# Patient Record
Sex: Female | Born: 1965 | Race: Black or African American | Hispanic: No | Marital: Single
Health system: Midwestern US, Community
[De-identification: ages and names within clinical notes are randomized; demographics above are authoritative.]

## PROBLEM LIST (undated history)

## (undated) DIAGNOSIS — M7732 Calcaneal spur, left foot: Secondary | ICD-10-CM

---

## 2017-05-05 NOTE — Other (Signed)
[facility]  Preoperative Instructions        Surgery Date 05/07/17          Time of Arrival 1430    1. On the day of your surgery, please report to the Surgical Services Registration Desk and sign in at your designated time. The Surgery Center is located to the right of the Emergency Room.     2. You must have someone with you to drive you home. You should not drive a car for 24 hours following surgery. Please make arrangements for a friend or family member to stay with you for the first 24 hours after your surgery.    3. Do not have anything to eat or drink (including water, gum, mints, coffee, juice) after midnight 05/06/17.??      Marland Kitchen?This may not apply to medications prescribed by your physician. ?(Please note below the special instructions with medications to take the morning of your procedure.)    4. We recommend you do not drink any alcoholic beverages for 24 hours before and after your surgery.    5. Contact your surgeon???s office for instructions on the following medications: non-steroidal anti-inflammatory drugs (i.e. Advil, Aleve), vitamins, and supplements. (Some surgeon???s will want you to stop these medications prior to surgery and others may allow you to take them)  **If you are currently taking Plavix, Coumadin, Aspirin and/or other blood-thinning agents, contact your surgeon for instructions.** Your surgeon will partner with the physician prescribing these medications to determine if it is safe to stop or if you need to continue taking.  Please do not stop taking these medications without instructions from your surgeon    6. Wear comfortable clothes.  Wear glasses instead of contacts.  Do not bring any money or jewelry. Please bring picture ID, insurance card, and any prearranged co-payment or hospital payment.  Do not wear make-up, particularly mascara the morning of your surgery.  Do not wear nail polish, particularly if you are having foot /hand surgery.  Wear your hair  loose or down, no ponytails, buns, bobby pins or clips.  All body piercings must be removed.  Please shower with antibacterial soap for three consecutive days before and on the morning of surgery, but do not apply any lotions, powders or deodorants after the shower on the day of surgery. Please use a fresh towels after each shower. Please sleep in clean clothes and change bed linens the night before surgery.  Please do not shave for 48 hours prior to surgery. Shaving of the face is acceptable.    7. You should understand that if you do not follow these instructions your surgery may be cancelled.  If your physical condition changes (I.e. fever, cold or flu) please contact your surgeon as soon as possible.    8. It is important that you be on time.  If a situation occurs where you may be late, please call [phone] (OR Holding Area).    9. If you have any questions and or problems, please call [phone] (Pre-admission Testing).    10. Your surgery time may be subject to change.  You will receive a phone call the evening prior if your time changes.    11.  If having outpatient surgery, you must have someone to drive you here, stay with you during the duration of your stay, and to drive you home at time of discharge.    12.   In an effort to improve the efficiency, privacy, and  safety for all of our Pre-op patients visitors are not allowed in the Holding area.  Once you arrive and are registered your family/visitors will be asked to remain in the waiting room.  The Pre-op staff will get you from the Surgical Waiting Area and will explain to you and your family/visitors that the Pre-op phase is beginning.  The staff will answer any questions and provide instructions for tracking of the patient, by use of the existing tracking number and color-coded status board in the waiting room.  At this time the staff will also ask for your designated spokesperson information  in the event that the physician or staff need to provide an update or obtain any pertinent information. The designated spokesperson will be notified if the physician needs to speak to family during the pre-operative phase.  If at any time your family/visitors has questions or concerns they may approach the volunteer desk in the waiting area for assistance.         Special Instructions:none    MEDICATIONS TO TAKE THE MORNING OF SURGERY WITH A SIP OF WATER:take migraine meds if needed.      I understand a pre-operative phone call will be made to verify my surgery time.  In the event that I am not available, I give permission for a message to be left on my answering service and/or with another person?  {yes @ [phone].         ___________________      __________   _________    (Signature of Patient)             (Witness)                (Date and Time)

## 2017-05-06 ENCOUNTER — Inpatient Hospital Stay: Admit: 2017-05-06 | Payer: BLUE CROSS/BLUE SHIELD | Attending: Podiatrist | Primary: Family Medicine

## 2017-05-06 DIAGNOSIS — Z01818 Encounter for other preprocedural examination: Secondary | ICD-10-CM

## 2017-05-06 LAB — EKG 12-LEAD
Atrial Rate: 75 {beats}/min
Diagnosis: NORMAL
P Axis: 32 degrees
P-R Interval: 164 ms
Q-T Interval: 384 ms
QRS Duration: 72 ms
QTc Calculation (Bazett): 428 ms
R Axis: 45 degrees
T Axis: 7 degrees
Ventricular Rate: 75 {beats}/min

## 2017-05-06 LAB — EKG, 12 LEAD, INITIAL
Atrial Rate: 75 {beats}/min
Calculated P Axis: 32 degrees
Calculated R Axis: 45 degrees
Calculated T Axis: 7 degrees
Diagnosis: NORMAL
P-R Interval: 164 ms
Q-T Interval: 384 ms
QRS Duration: 72 ms
QTC Calculation (Bezet): 428 ms
Ventricular Rate: 75 {beats}/min

## 2017-05-06 LAB — CBC W/O DIFF
ABSOLUTE NRBC: 0 10*3/uL (ref 0.00–0.01)
HCT: 37.8 % (ref 35.0–47.0)
HGB: 12.7 g/dL (ref 11.5–16.0)
MCH: 31.5 PG (ref 26.0–34.0)
MCHC: 33.6 g/dL (ref 30.0–36.5)
MCV: 93.8 FL (ref 80.0–99.0)
MPV: 9.7 FL (ref 8.9–12.9)
NRBC: 0 PER 100 WBC
PLATELET: 297 10*3/uL (ref 150–400)
RBC: 4.03 M/uL (ref 3.80–5.20)
RDW: 13.5 % (ref 11.5–14.5)
WBC: 9.7 10*3/uL (ref 3.6–11.0)

## 2017-05-06 LAB — METABOLIC PANEL, BASIC
Anion gap: 7 mmol/L (ref 5–15)
BUN/Creatinine ratio: 25 — ABNORMAL HIGH (ref 12–20)
BUN: 24 MG/DL — ABNORMAL HIGH (ref 6–20)
CO2: 24 mmol/L (ref 21–32)
Calcium: 9.2 MG/DL (ref 8.5–10.1)
Chloride: 110 mmol/L — ABNORMAL HIGH (ref 97–108)
Creatinine: 0.96 MG/DL (ref 0.55–1.02)
GFR est AA: >60 mL/min/{1.73_m2} (ref >60)
GFR est non-AA: >60 mL/min/{1.73_m2} (ref >60)
Glucose: 102 mg/dL — ABNORMAL HIGH (ref 65–100)
Potassium: 4.4 mmol/L (ref 3.5–5.1)
Sodium: 141 mmol/L (ref 136–145)

## 2017-05-06 NOTE — Other (Signed)
Patient here for labs and EKG only for surgery on 9/28.  CBC and BMP drawn and sent.  EKG completed.

## 2017-05-07 ENCOUNTER — Inpatient Hospital Stay: Payer: BLUE CROSS/BLUE SHIELD

## 2017-05-07 LAB — HCG URINE, QL. - POC: Pregnancy test,urine (POC): NEGATIVE

## 2017-05-07 MED ORDER — KETAMINE 50 MG/ML IJ SOLN
50 mg/mL | INTRAMUSCULAR | Status: AC
Start: 2017-05-07 — End: ?

## 2017-05-07 MED ORDER — KETAMINE 10 MG/ML IJ SOLN
10 mg/mL | INTRAMUSCULAR | Status: DC | PRN
Start: 2017-05-07 — End: 2017-05-07
  Administered 2017-05-07 (×2): via INTRAVENOUS

## 2017-05-07 MED ORDER — ONDANSETRON (PF) 4 MG/2 ML INJECTION
4 mg/2 mL | INTRAMUSCULAR | Status: DC | PRN
Start: 2017-05-07 — End: 2017-05-07
  Administered 2017-05-07: 22:00:00 via INTRAVENOUS

## 2017-05-07 MED ORDER — CEFAZOLIN 2 GRAM/20 ML IN STERILE WATER INTRAVENOUS SYRINGE
2 gram/0 mL | Freq: Once | INTRAVENOUS | Status: AC
Start: 2017-05-07 — End: 2017-05-07
  Administered 2017-05-07: 21:00:00 via INTRAVENOUS

## 2017-05-07 MED ORDER — LIDOCAINE (PF) 20 MG/ML (2 %) IJ SOLN
20 mg/mL (2 %) | INTRAMUSCULAR | Status: AC
Start: 2017-05-07 — End: ?

## 2017-05-07 MED ORDER — KETOROLAC TROMETHAMINE 30 MG/ML INJECTION
30 mg/mL (1 mL) | Freq: Once | INTRAMUSCULAR | Status: AC
Start: 2017-05-07 — End: 2017-05-07

## 2017-05-07 MED ORDER — PROPOFOL 10 MG/ML IV EMUL
10 mg/mL | INTRAVENOUS | Status: AC
Start: 2017-05-07 — End: ?

## 2017-05-07 MED ORDER — PHENYLEPHRINE IN 0.9 % SODIUM CL (40 MCG/ML) IV SYRINGE
0.4 mg/10 mL (40 mcg/mL) | INTRAVENOUS | Status: AC
Start: 2017-05-07 — End: ?

## 2017-05-07 MED ORDER — GLYCOPYRROLATE 0.2 MG/ML IJ SOLN
0.2 mg/mL | INTRAMUSCULAR | Status: DC | PRN
Start: 2017-05-07 — End: 2017-05-07
  Administered 2017-05-07: 21:00:00 via INTRAVENOUS

## 2017-05-07 MED ORDER — BUPIVACAINE-EPINEPHRINE (PF) 0.5 %-1:200,000 IJ SOLN
0.5 %-1:200,000 | INTRAMUSCULAR | Status: DC | PRN
Start: 2017-05-07 — End: 2017-05-07
  Administered 2017-05-07: 21:00:00 via SUBCUTANEOUS

## 2017-05-07 MED ORDER — LIDOCAINE (PF) 20 MG/ML (2 %) IJ SOLN
20 mg/mL (2 %) | INTRAMUSCULAR | Status: DC | PRN
Start: 2017-05-07 — End: 2017-05-07
  Administered 2017-05-07: 21:00:00 via INTRAVENOUS

## 2017-05-07 MED ORDER — FENTANYL CITRATE (PF) 50 MCG/ML IJ SOLN
50 mcg/mL | INTRAMUSCULAR | Status: AC
Start: 2017-05-07 — End: ?

## 2017-05-07 MED ORDER — DEXAMETHASONE SODIUM PHOSPHATE 4 MG/ML IJ SOLN
4 mg/mL | INTRAMUSCULAR | Status: AC
Start: 2017-05-07 — End: ?

## 2017-05-07 MED ORDER — KETOROLAC TROMETHAMINE 30 MG/ML INJECTION
30 mg/mL (1 mL) | INTRAMUSCULAR | Status: AC
Start: 2017-05-07 — End: 2017-05-07
  Administered 2017-05-07: 22:00:00 via INTRAVENOUS

## 2017-05-07 MED ORDER — METHYLPREDNISOLONE (PF) 125 MG/2 ML IJ SOLR
125 mg/2 mL | INTRAMUSCULAR | Status: AC
Start: 2017-05-07 — End: ?

## 2017-05-07 MED ORDER — ONDANSETRON (PF) 4 MG/2 ML INJECTION
4 mg/2 mL | INTRAMUSCULAR | Status: AC
Start: 2017-05-07 — End: ?

## 2017-05-07 MED ORDER — OXYCODONE-ACETAMINOPHEN 5 MG-325 MG TAB
5-325 mg | ORAL_TABLET | ORAL | 0 refills | Status: AC | PRN
Start: 2017-05-07 — End: ?

## 2017-05-07 MED ORDER — PROPOFOL 10 MG/ML IV EMUL
10 mg/mL | INTRAVENOUS | Status: DC | PRN
Start: 2017-05-07 — End: 2017-05-07
  Administered 2017-05-07 (×3): via INTRAVENOUS

## 2017-05-07 MED ORDER — CEFAZOLIN 2 GRAM/20 ML IN STERILE WATER INTRAVENOUS SYRINGE
2 gram/0 mL | INTRAVENOUS | Status: AC
Start: 2017-05-07 — End: ?

## 2017-05-07 MED ORDER — IBUPROFEN 200 MG TAB
200 mg | ORAL_TABLET | Freq: Three times a day (TID) | ORAL | 1 refills | Status: AC | PRN
Start: 2017-05-07 — End: ?

## 2017-05-07 MED ORDER — BUPIVACAINE-EPINEPHRINE (PF) 0.5 %-1:200,000 IJ SOLN
0.5 %-1:200,000 | INTRAMUSCULAR | Status: AC
Start: 2017-05-07 — End: ?

## 2017-05-07 MED ORDER — LACTATED RINGERS IV
INTRAVENOUS | Status: DC
Start: 2017-05-07 — End: 2017-05-07
  Administered 2017-05-07: 19:00:00 via INTRAVENOUS

## 2017-05-07 MED ORDER — PROPOFOL 10 MG/ML IV EMUL
10 mg/mL | INTRAVENOUS | Status: DC | PRN
Start: 2017-05-07 — End: 2017-05-07
  Administered 2017-05-07: 21:00:00 via INTRAVENOUS

## 2017-05-07 MED ORDER — BUPIVACAINE (PF) 0.5 % (5 MG/ML) IJ SOLN
0.5 % (5 mg/mL) | INTRAMUSCULAR | Status: DC | PRN
Start: 2017-05-07 — End: 2017-05-07
  Administered 2017-05-07: 21:00:00 via SUBCUTANEOUS

## 2017-05-07 MED ORDER — FENTANYL CITRATE (PF) 50 MCG/ML IJ SOLN
50 mcg/mL | INTRAMUSCULAR | Status: DC | PRN
Start: 2017-05-07 — End: 2017-05-07
  Administered 2017-05-07: 21:00:00 via INTRAVENOUS

## 2017-05-07 MED ORDER — OXYCODONE-ACETAMINOPHEN 5 MG-325 MG TAB
5-325 mg | Freq: Once | ORAL | Status: AC
Start: 2017-05-07 — End: 2017-05-07
  Administered 2017-05-07: 22:00:00 via ORAL

## 2017-05-07 MED ORDER — MIDAZOLAM 1 MG/ML IJ SOLN
1 mg/mL | INTRAMUSCULAR | Status: AC
Start: 2017-05-07 — End: ?

## 2017-05-07 MED ORDER — OXYCODONE-ACETAMINOPHEN 5 MG-325 MG TAB
5-325 mg | ORAL | Status: DC
Start: 2017-05-07 — End: 2017-05-07

## 2017-05-07 MED ORDER — DEXAMETHASONE SODIUM PHOSPHATE 4 MG/ML IJ SOLN
4 mg/mL | INTRAMUSCULAR | Status: DC | PRN
Start: 2017-05-07 — End: 2017-05-07
  Administered 2017-05-07: 21:00:00 via INTRAVENOUS

## 2017-05-07 MED ORDER — MIDAZOLAM 1 MG/ML IJ SOLN
1 mg/mL | INTRAMUSCULAR | Status: DC | PRN
Start: 2017-05-07 — End: 2017-05-07
  Administered 2017-05-07: 20:00:00 via INTRAVENOUS

## 2017-05-07 MED ORDER — BUPIVACAINE (PF) 0.5 % (5 MG/ML) IJ SOLN
0.5 % (5 mg/mL) | INTRAMUSCULAR | Status: AC
Start: 2017-05-07 — End: ?

## 2017-05-07 MED FILL — FENTANYL CITRATE (PF) 50 MCG/ML IJ SOLN: 50 mcg/mL | INTRAMUSCULAR | Qty: 2

## 2017-05-07 MED FILL — DEXAMETHASONE SODIUM PHOSPHATE 4 MG/ML IJ SOLN: 4 mg/mL | INTRAMUSCULAR | Qty: 5

## 2017-05-07 MED FILL — KETOROLAC TROMETHAMINE 30 MG/ML INJECTION: 30 mg/mL (1 mL) | INTRAMUSCULAR | Qty: 1

## 2017-05-07 MED FILL — PROPOFOL 10 MG/ML IV EMUL: 10 mg/mL | INTRAVENOUS | Qty: 20

## 2017-05-07 MED FILL — PROPOFOL 10 MG/ML IV EMUL: 10 mg/mL | INTRAVENOUS | Qty: 100

## 2017-05-07 MED FILL — MIDAZOLAM 1 MG/ML IJ SOLN: 1 mg/mL | INTRAMUSCULAR | Qty: 2

## 2017-05-07 MED FILL — SENSORCAINE-MPF/EPINEPHRINE 0.5 %-1:200,000 INJECTION SOLUTION: 0.5 %-1:200,000 | INTRAMUSCULAR | Qty: 30

## 2017-05-07 MED FILL — LACTATED RINGERS IV: INTRAVENOUS | Qty: 1000

## 2017-05-07 MED FILL — ONDANSETRON (PF) 4 MG/2 ML INJECTION: 4 mg/2 mL | INTRAMUSCULAR | Qty: 2

## 2017-05-07 MED FILL — OXYCODONE-ACETAMINOPHEN 5 MG-325 MG TAB: 5-325 mg | ORAL | Qty: 1

## 2017-05-07 MED FILL — PHENYLEPHRINE IN 0.9 % SODIUM CL (40 MCG/ML) IV SYRINGE: 0.4 mg/10 mL (40 mcg/mL) | INTRAVENOUS | Qty: 10

## 2017-05-07 MED FILL — BUPIVACAINE (PF) 0.5 % (5 MG/ML) IJ SOLN: 0.5 % (5 mg/mL) | INTRAMUSCULAR | Qty: 30

## 2017-05-07 MED FILL — LIDOCAINE (PF) 20 MG/ML (2 %) IJ SOLN: 20 mg/mL (2 %) | INTRAMUSCULAR | Qty: 5

## 2017-05-07 MED FILL — CEFAZOLIN 2 GRAM/20 ML IN STERILE WATER INTRAVENOUS SYRINGE: 2 gram/0 mL | INTRAVENOUS | Qty: 20

## 2017-05-07 MED FILL — KETAMINE 50 MG/ML IJ SOLN: 50 mg/mL | INTRAMUSCULAR | Qty: 10

## 2017-05-07 MED FILL — SOLU-MEDROL (PF) 125 MG/2 ML SOLUTION FOR INJECTION: 125 mg/2 mL | INTRAMUSCULAR | Qty: 2

## 2017-05-07 NOTE — Op Note (Signed)
[place]  OPERATIVE REPORT    Name:Julie Decker, [facility]  MR#: [id number]  DOB: 1965-09-28  ACCOUNT #: [id number]   DATE OF SERVICE: 05/07/2017    PREOPERATIVE DIAGNOSIS:  Heel spur syndrome, left foot.    POSTOPERATIVE DIAGNOSIS:  Heel spur syndrome, left foot.    PROCEDURE PERFORMED:  Plantar fascial release with resection of heel spur.    SURGEON:  Arna Snipe, DPM    ASSISTANT: none    ANESTHESIA:  MAC.    ESTIMATED BLOOD LOSS:  Minimal.    SPECIMENS REMOVED:  Soft tissue possible neuroma.    COMPLICATIONS: none     IMPLANTS:  none.     INDICATIONS:  This 51 year old black female presents with a painful left heel for several months.  Conservative therapy has failed to alleviate the patient of her symptoms.  At this time, surgical intervention has been opted as the treatment chose by the patient.  Medical history and physical has been performed.  The patient wished for surgery.      Physical examination revealed palpable pedal pulses with fairly good muscle strength in lower extremities bilateral.  Epicritic sensation intact.  Severe pain upon palpation plantar left arch and heel.  X-rays taken reveal a well-defined inferior heel spur.    DESCRIPTION OF PROCEDURE:  The patient was brought into the operating room and placed on the operating table in supine position.  Under the influence of IV sedation, anesthesia was achieved via an ankle block using 2% Xylocaine plain.  The left foot was now prepped and draped in the usual sterile manner.  A successful timeout was completed.  Next, using #10 blade, a transverse incision was made over the approximate insertion of the plantar fascia into the medial tubercle.  The incision was deepened using sharp and blunt dissection, being careful to identify and retract vital structures.  The plantar fascia was identified and a trapezoidal wedge was taken from the medial fascial band as well as a portion of the central band and inferior exostosis was identified  and was removed using rongeur bone cutting forceps and the remainder of the heel was remodeled to be smoothed and contoured using a reciprocating rasp.  The wound was flushed with copious amounts of sterile saline and free of all debris.  Intraoperative x-rays were used to ensure eradication of the inferior heel spur.  All deep fascia and superficial fascia were reapproximated using 4-0 Vicryl in a running stitch manner and all skin edges were approximated using 4-0 nylon with horizontal mattress sutures.  Adaptic followed by dry sterile dressings were placed on the left foot and the ankle tourniquet was released.  Good color return was noted to all digits 1-5, left foot.  Patient appeared to have tolerated anesthesia and procedure well, was transferred from the operating room to recovery.  All vital signs are stable and vascular status intact to all digits 1-5, left foot.  Preoperatively, she was given 2 g of Ancef IV.      Arna Snipe, DPM       SF / DN  D: 05/07/2017 17:53     T: 05/07/2017 21:56  JOB #: [id number]

## 2017-05-07 NOTE — H&P (Signed)
History and Physical    Subjective:     Julie Decker is a 51 y.o. African American female who presents with painful left heel of several months. Onset of symptoms was gradual with gradually worsening course since that time. The pain is located plantar left arch and heel. Patient describes the pain as continuous and rated as severe. Pain has been associated with standing. Patient denies any type of trauma. Symptoms are aggravated by walking and standing.   Previous studies include xray.    Past Medical History:   Diagnosis Date   ??? Arthritis     knees, right foot, bilat shoulders   ??? GERD (gastroesophageal reflux disease)    ??? Ill-defined condition     migraines      Past Surgical History:   Procedure Laterality Date   ??? HX ORTHOPAEDIC Right 2007    foot, plantar fasceitis     Family History   Problem Relation Age of Onset   ??? Heart Disease Mother    ??? Hypertension Father    ??? Diabetes Father       Social History   Substance Use Topics   ??? Smoking status: Never Smoker   ??? Smokeless tobacco: Never Used   ??? Alcohol use No       Prior to Admission medications    Medication Sig Start Date End Date Taking? Authorizing Provider   naproxen (NAPROSYN) 250 mg tablet Take  by mouth two (2) times daily (with meals).   Yes Historical Provider   onabotulinumtoxinA (BOTOX INJECTION) by IntraMUSCular route. For migraines   Yes Historical Provider   SUMAtriptan (IMITREX) 100 mg tablet Take 100 mg by mouth once as needed for Migraine.   Yes Historical Provider   oxymetazoline (AFRIN, OXYMETAZOLINE,) 0.05 % nasal spray 2 Sprays two (2) times a day.   Yes Historical Provider   SUMAtriptan succinate (IMITREX STATDOSE PEN) 6 mg/0.5 mL kit 6 mg by SubCUTAneous route once as needed for Migraine.    Historical Provider   predniSONE (STERAPRED DS) 10 mg dose pack Take 10 mg by mouth See Admin Instructions. See administration instruction per $RemoveBeforeD'10mg'YLqOoaRjsLcEas$  dose pack    Historical Provider     Allergies   Allergen Reactions    ??? Contrast Dye [Iodine] Anaphylaxis   ??? Topamax [Topiramate] Other (comments)     disorientation        Review of Systems:  A comprehensive review of systems was negative.   Wears glasses  Objective:     Intake and Output:            Physical Exam:   Visit Vitals   ??? BP 119/88 (BP 1 Location: Right arm, BP Patient Position: At rest)   ??? Pulse 61   ??? Temp 97.8 ??F (36.6 ??C)   ??? Resp 12   ??? Ht $Remo'5\' 5"'cGiAW$  (1.651 m)   ??? Wt 92.4 kg (203 lb 11.3 oz)   ??? LMP 02/17/2017 (Approximate)   ??? SpO2 99%   ??? BMI 33.9 kg/m2     General:  Alert, cooperative, no distress, appears stated age.   Head:  Normocephalic, without obvious abnormality, atraumatic.   Eyes:  Conjunctivae/corneas clear. PERRL,    Ears:  Normal  external ear canals both ears.   Nose: Nares normal. Septum midline. Mucosa normal. No drainage or sinus tenderness.   Throat: Lips, mucosa, and tongue normal. Teeth and gums normal.   Neck: Supple, symmetrical, trachea midline, no adenopathy, thyroid: no enlargement/tenderness/nodules,   Back:  Symmetric, no curvature. ROM normal.    Lungs:   Clear to auscultation bilaterally.   Chest wall:  No tenderness or deformity.   Heart:  Regular rate and rhythm, S1, S2 normal, no murmur, click, rub or gallop.                   Extremities: Extremities normal, atraumatic, no cyanosis or edema.   Pulses: 2+ and symmetric all extremities.   Skin: Skin color, texture, turgor normal. No rashes or lesions.   Lymph nodes: Cervical, supraclavicular, and axillary nodes normal.   Neurologic: CNII-XII intact. Normal strength, sensation and reflexes throughout.       LOWER EXTREMITIES:  Palpable pedal pulses   Epicritic sensation intact   Pain upon palpation left arch and heel    XRAY:   Well defined inferior heel spur    Data Review:   Recent Results (from the past 24 hour(s))   HCG URINE, QL. - POC    Collection Time: 05/07/17  3:00 PM   Result Value Ref Range    Pregnancy test,urine (POC) NEGATIVE  NEG             Assessment:      Principal Problem:    Heel spur, left (05/07/2017)        Plan:   Scheduled for plantar fascial release with resection of heel spur left foot.  Discussed risks, benefits, expected outcome as well as post op course      Signed By: Ila Mcgill, DPM     May 07, 2017

## 2017-05-07 NOTE — Other (Signed)
TRANSFER - IN REPORT:    Verbal report received from M. Coleman RN(name) on [name]  being received from OR(unit) for routine post - op      Report consisted of patient???s Situation, Background, Assessment and   Recommendations(SBAR).     Information from the following report(s) OR Summary was reviewed with the receiving nurse.    Opportunity for questions and clarification was provided.      Assessment completed upon patient???s arrival to unit and care assumed.

## 2017-05-07 NOTE — Anesthesia Pre-Procedure Evaluation (Addendum)
Anesthetic History   No history of anesthetic complications            Review of Systems / Medical History  Patient summary reviewed, nursing notes reviewed and pertinent labs reviewed    Pulmonary  Within defined limits                 Neuro/Psych   Within defined limits           Cardiovascular  Within defined limits                Exercise tolerance: >4 METS     GI/Hepatic/Renal     GERD: well controlled           Endo/Other        Arthritis     Other Findings            Physical Exam    Airway  Mallampati: II  TM Distance: 4 - 6 cm  Neck ROM: normal range of motion   Mouth opening: Normal     Cardiovascular  Regular rate and rhythm,  S1 and S2 normal,  no murmur, click, rub, or gallop             Dental    Dentition: Upper partial plate     Pulmonary  Breath sounds clear to auscultation               Abdominal  GI exam deferred       Other Findings            Anesthetic Plan    ASA: 2  Anesthesia type: total IV anesthesia and MAC          Induction: Intravenous  Anesthetic plan and risks discussed with: Patient

## 2017-05-07 NOTE — Op Note (Signed)
[place]  OPERATIVE REPORT    Name:Gerstner, [facility]  MR#: [id number]  DOB: March 13, 1966  ACCOUNT #: [id number]   DATE OF SERVICE: 05/07/2017    PREOPERATIVE DIAGNOSIS:  Heel spur syndrome, left foot.    POSTOPERATIVE DIAGNOSIS:  Heel spur syndrome, left foot.    PROCEDURE PERFORMED:  Plantar fascial release with resection of heel spur.    SURGEON:  Arna Snipe, DPM    ASSISTANT: none    ANESTHESIA:  MAC.    ESTIMATED BLOOD LOSS:  Minimal.    SPECIMENS REMOVED:  Soft tissue possible neuroma.    COMPLICATIONS: none     IMPLANTS:  none.     INDICATIONS:  This 51 year old black female presents with a painful left heel for several months.  Conservative therapy has failed to alleviate the patient of her symptoms.  At this time, surgical intervention has been opted as the treatment chose by the patient.  Medical history and physical has been performed.  The patient wished for surgery.      Physical examination revealed palpable pedal pulses with fairly good muscle strength in lower extremities bilateral.  Epicritic sensation intact.  Severe pain upon palpation plantar left arch and heel.  X-rays taken reveal a well-defined inferior heel spur.    DESCRIPTION OF PROCEDURE:  The patient was brought into the operating room and placed on the operating table in supine position.  Under the influence of IV sedation, anesthesia was achieved via an ankle block using 2% Xylocaine plain.  The left foot was now prepped and draped in the usual sterile manner.  A successful timeout was completed.  Next, using #10 blade, a transverse incision was made over the approximate insertion of the plantar fascia into the medial tubercle.  The incision was deepened using sharp and blunt dissection, being careful to identify and retract vital structures.  The plantar fascia was identified and a trapezoidal wedge was taken from the medial fascial band as well as a portion of the  central band and inferior exostosis was identified and was removed using rongeur bone cutting forceps and the remainder of the heel was remodeled to be smoothed and contoured using a reciprocating rasp.  The wound was flushed with copious amounts of sterile saline and free of all debris.  Intraoperative x-rays were used to ensure eradication of the inferior heel spur.  All deep fascia and superficial fascia were reapproximated using 4-0 Vicryl in a running stitch manner and all skin edges were approximated using 4-0 nylon with horizontal mattress sutures.  Adaptic followed by dry sterile dressings were placed on the left foot and the ankle tourniquet was released.  Good color return was noted to all digits 1-5, left foot.  Patient appeared to have tolerated anesthesia and procedure well, was transferred from the operating room to recovery.  All vital signs are stable and vascular status intact to all digits 1-5, left foot.  Preoperatively, she was given 2 g of Ancef IV.      Arna Snipe, DPM       SF / DN  D: 05/07/2017 17:53     T: 05/07/2017 21:56  JOB #: [id number]

## 2017-05-07 NOTE — Brief Op Note (Signed)
BRIEF OPERATIVE NOTE    Date of Procedure: 05/07/2017   Preoperative Diagnosis: HEEL SPUR LEFT FOOT   Postoperative Diagnosis: HEEL SPUR LEFT FOOT     Procedure(s):  PLANTAR FASCIAL RELEASE WITH RESECTION OF HEEL SPUR LEFT FOOT   Surgeon(s) and Role:     * Ila Mcgill, DPM - Primary         Surgical Assistant:none    Surgical Staff:  Circ-1: Fredia Sorrow, RN  Scrub Tech-1: Gwynneth Albright  Event Time In   Incision Start 1655   Incision Close 1731     Anesthesia: MAC   Estimated Blood Loss: min  Specimens:   ID Type Source Tests Collected by Time Destination   1 : Nerve [address]  Ila Mcgill, DPM 05/07/2017 1732 Pathology      Findings: hypertrophic bone  Complications: none  Implants: * No implants in log *

## 2017-05-07 NOTE — Anesthesia Post-Procedure Evaluation (Signed)
Post-Anesthesia Evaluation and Assessment    Patient: Randalyn Ahmed MRN: [id number]  SSN: [id number]    Date of Birth: 1965-11-28  Age: 51 y.o.  Sex: female       Cardiovascular Function/Vital Signs  Visit Vitals   ??? BP 129/88   ??? Pulse (!) 57   ??? Temp 36.7 ??C (98 ??F)   ??? Resp 12   ??? Ht 5' 5"  (1.651 m)   ??? Wt 92.4 kg (203 lb 11.3 oz)   ??? SpO2 100%   ??? BMI 33.9 kg/m2       Patient is status post total IV anesthesia, MAC anesthesia for Procedure(s):  PLANTAR FASCIA RELEASE WITH HEEL SPUR LEFT FOOT .    Nausea/Vomiting: None    Postoperative hydration reviewed and adequate.    Pain:  Pain Scale 1: Numeric (0 - 10) (05/07/17 1830)  Pain Intensity 1: 0 (05/07/17 1830)   Managed    Neurological Status:   Neuro (WDL): Exceptions to [facility] (05/07/17 1830)  Neuro  Neurologic State: Alert;Drowsy;Eyes open spontaneously (05/07/17 1830)  Orientation Level: Oriented to person;Oriented to place;Oriented to situation (05/07/17 1830)  Cognition: Follows commands (05/07/17 1830)  Speech: Clear (05/07/17 1830)  LUE Motor Response: Purposeful (05/07/17 1830)  LLE Motor Response: Purposeful (05/07/17 1830)  RUE Motor Response: Purposeful (05/07/17 1830)  RLE Motor Response: Purposeful (05/07/17 1830)   At baseline    Mental Status and Level of Consciousness: Arousable    Pulmonary Status:   O2 Device: Room air (05/07/17 1830)   Adequate oxygenation and airway patent    Complications related to anesthesia: None    Post-anesthesia assessment completed. No concerns    Signed By: Joyice Faster, MD     May 07, 2017

## 2017-05-11 MED FILL — KETAMINE 100 MG/ML IJ SOLN: 100 mg/mL | INTRAMUSCULAR | Qty: 30

## 2017-05-11 MED FILL — GLYCOPYRROLATE 0.2 MG/ML IJ SOLN: 0.2 mg/mL | INTRAMUSCULAR | Qty: 0.2

## 2017-05-11 MED FILL — ONDANSETRON (PF) 4 MG/2 ML INJECTION: 4 mg/2 mL | INTRAMUSCULAR | Qty: 2

## 2017-05-11 MED FILL — LIDOCAINE (PF) 20 MG/ML (2 %) IJ SOLN: 20 mg/mL (2 %) | INTRAMUSCULAR | Qty: 100

## 2017-05-11 MED FILL — DEXAMETHASONE SODIUM PHOSPHATE 4 MG/ML IJ SOLN: 4 mg/mL | INTRAMUSCULAR | Qty: 4

## 2017-05-11 MED FILL — DIPRIVAN 10 MG/ML INTRAVENOUS EMULSION: 10 mg/mL | INTRAVENOUS | Qty: 683.76

## 2017-05-11 MED FILL — DIPRIVAN 10 MG/ML INTRAVENOUS EMULSION: 10 mg/mL | INTRAVENOUS | Qty: 50

## 2023-07-29 IMAGING — MR MRI SHOULDER RT W/O CONTRAST
7 series · 40 of 40 positions shown · IV contrast (gadolinium)
Comparison: None.

﻿

MRI RIGHT SHOULDER
INDICATION: RIGHT SHOULDER PAIN
TECHNIQUE: MRI of the right shoulder was performed on a low field strength open MRI scanner.  Multiplanar, multisequence imaging was performed including coronal T1, T2 and inversion recovery; sagittal T2 and IR; and axial T2 gradient echo sequences without gadolinium.

[Series 3: scano c/s/t · axial · right · 8.0mm · 0.86mm/px · z∈[-12,+110]mm · 3 of 9 slices shown]
[im 1/9]
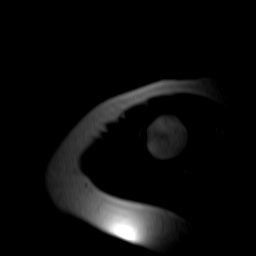
[im 5/9]
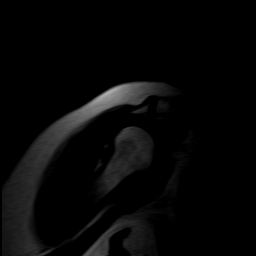
[im 9/9]
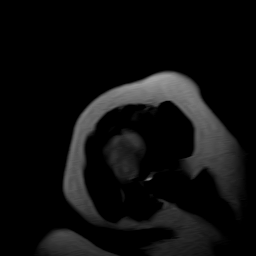

[Series 4: T2 · axial · right · 4.0mm · 0.82mm/px · z∈[-14,+65]mm · 7 of 18 slices shown (1 of 3)]
[im 1/18]
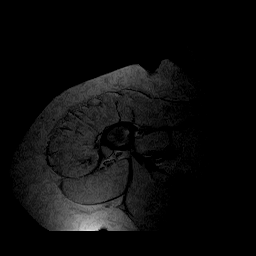
[im 3/18]
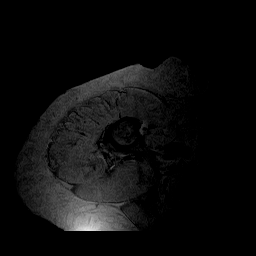
[im 6/18]
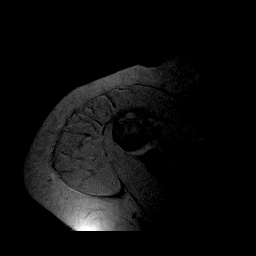
[im 9/18]
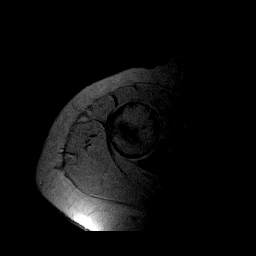
[im 12/18]
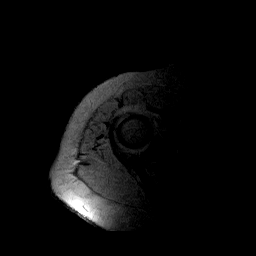
[im 15/18]
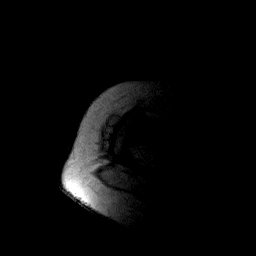
[im 18/18]
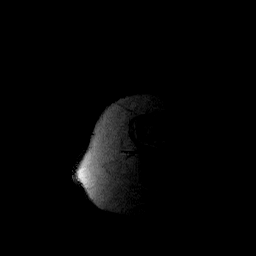

[Series 5: T2 · oblique · right · 4.0mm · 0.94mm/px · 6 of 16 slices shown (2 of 3)]
[im 1/16]
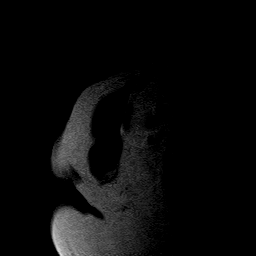
[im 4/16]
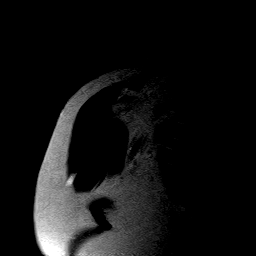
[im 7/16]
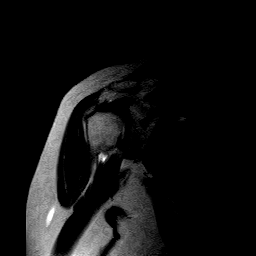
[im 10/16]
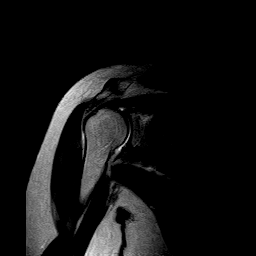
[im 13/16]
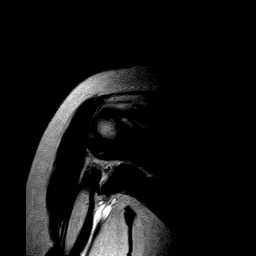
[im 16/16]
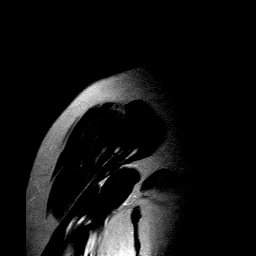

[Series 6: ir cor · oblique · right · 4.0mm · 0.82mm/px · 6 of 16 slices shown]
[im 1/16]
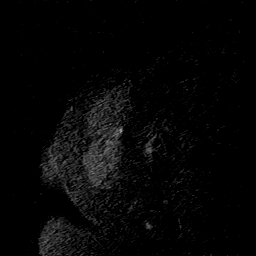
[im 4/16]
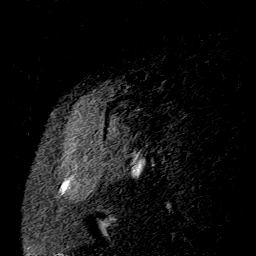
[im 7/16]
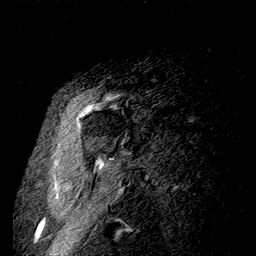
[im 10/16]
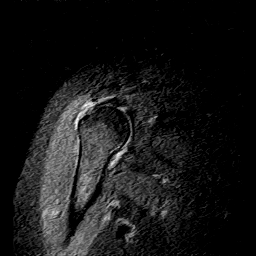
[im 13/16]
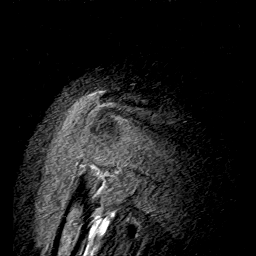
[im 16/16]
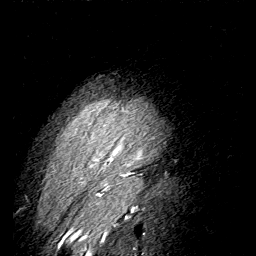

[Series 7: T1 · oblique · right · 4.0mm · 0.47mm/px · 6 of 16 slices shown]
[im 1/16]
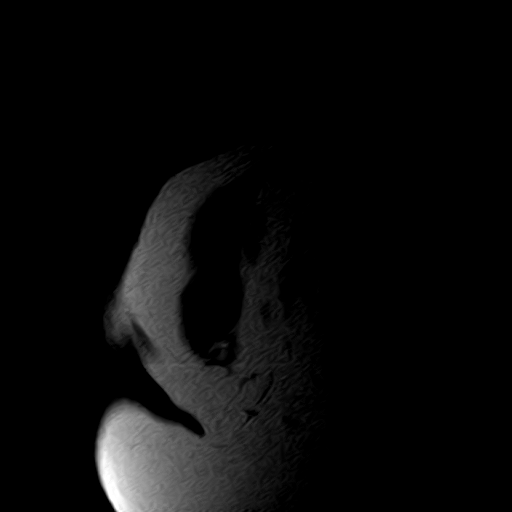
[im 4/16]
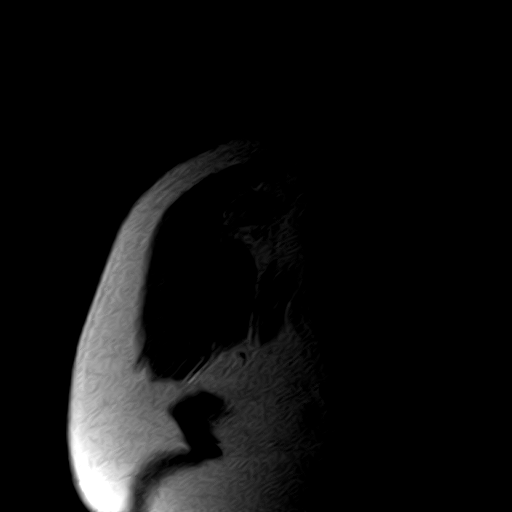
[im 7/16]
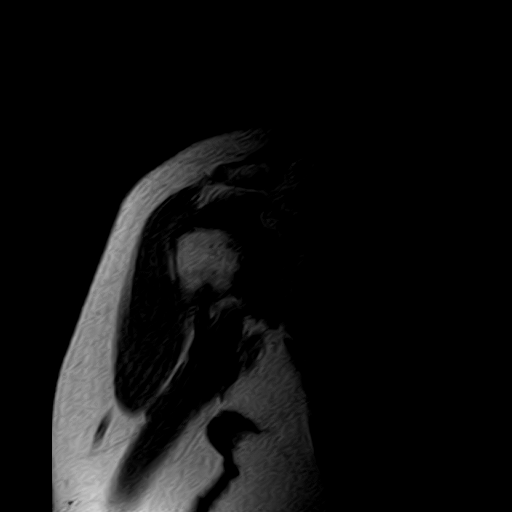
[im 10/16]
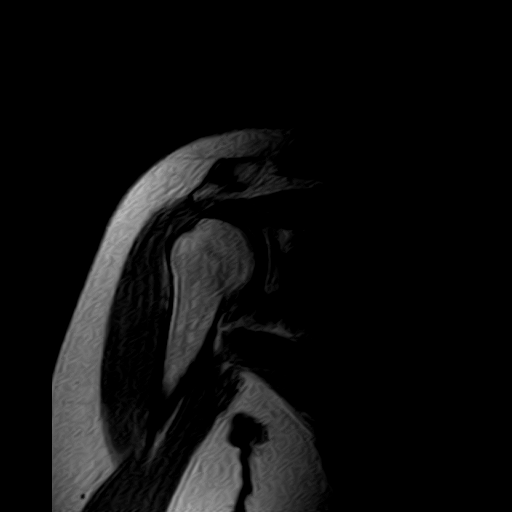
[im 13/16]
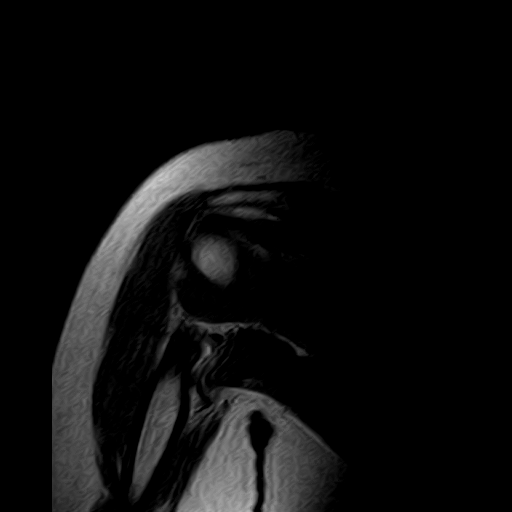
[im 16/16]
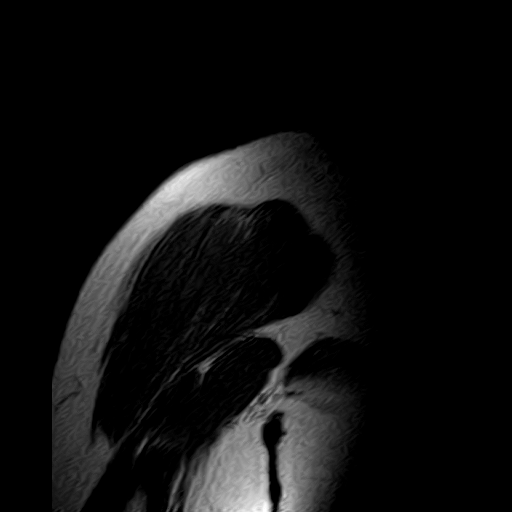

[Series 8: T2 · coronal · right · 4.0mm · 0.82mm/px · 6 of 16 slices shown (3 of 3)]
[im 1/16]
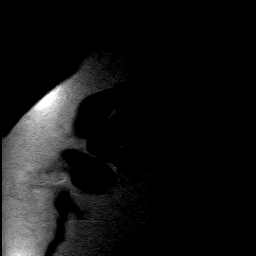
[im 4/16]
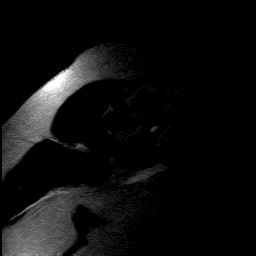
[im 7/16]
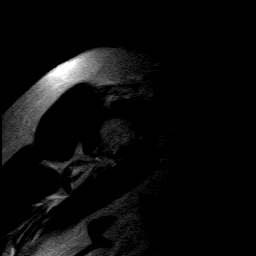
[im 10/16]
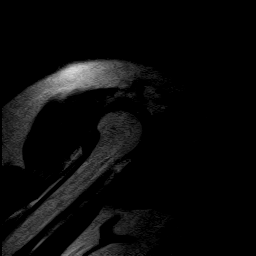
[im 13/16]
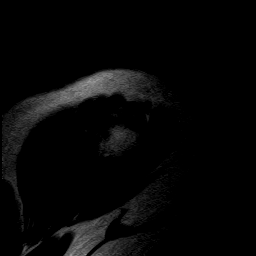
[im 16/16]
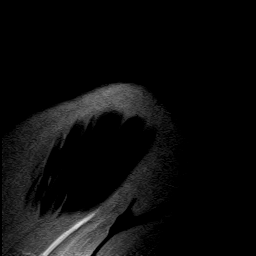

[Series 9: ir sag · coronal · right · 4.0mm · 0.82mm/px · 6 of 16 slices shown]
[im 1/16]
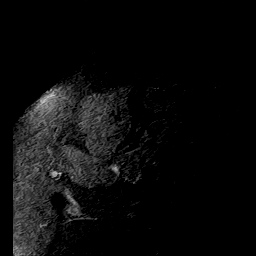
[im 4/16]
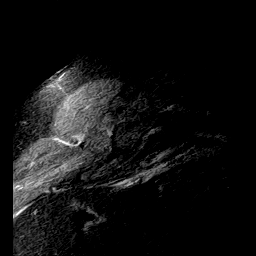
[im 7/16]
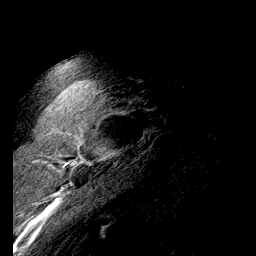
[im 10/16]
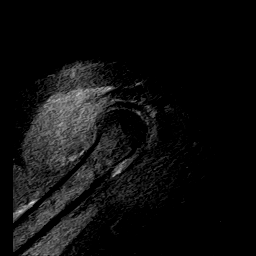
[im 13/16]
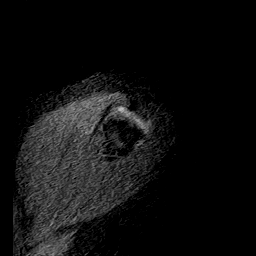
[im 16/16]
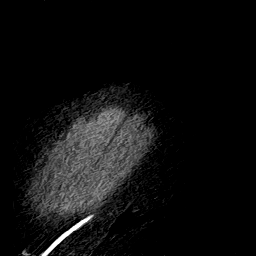

[40 of 40 positions shown; findings below may reference images not displayed]

FINDINGS: Rotator cuff:  Complete full-thickness tear of the supraspinatus tendon, extending into the anterior leading-edge fibers infraspinatus, demonstrating tendon retraction to the mid humeral head/acromial tip.  There is adjacent bursal fluid accumulation, as well as edema along the retracted tendon fibers and perimuscular boundaries.  Full-thickness tear superior insertional fibers of subscapularis with secondary subsequent medial subluxation of the biceps tendon.  The posterior infraspinatus fibers and teres minor are intact.  No rotator cuff muscle atrophy or fatty infiltration is identified.

Labrum/biceps: There is degenerative tearing of the superior labrum, as well as undercutting the biceps-labral anchor.  Biceps tendon insertion is intact.

Ligament/Capsule: There is no capsular hypertrophy or pericapsular edema.  The rotator interval signal is normal.

Cartilage/Bone/joint: No Hill Sachs or osseous Bankart lesion is identified. Marrow signal is within normal limits.  Thinning of the glenohumeral articular cartilage with osteophyte formation.

Acromioclavicular joint: Type II acromial morphology.  Mild AC joint arthrosis with small undersurface osteophytes
IMPRESSION: Complete full-thickness tear of the supraspinatus tendon extending into the anterior leading-edge fibers of infraspinatus, retracted to the mid humeral head.  

Full-thickness tear superior fibers of subscapularis with secondary medial subluxation of the biceps tendon.  

Mild-to-moderate glenohumeral arthrosis
